# Patient Record
Sex: Female | Born: 1980 | Race: White | Hispanic: No | Marital: Married | Smoking: Never smoker
Health system: Southern US, Community
[De-identification: ages and names within clinical notes are randomized; demographics above are authoritative.]

---
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
## 1999-03-03 ENCOUNTER — Inpatient Hospital Stay (HOSPITAL_COMMUNITY): Admission: AD | Admit: 1999-03-03 | Discharge: 1999-03-03 | Payer: Self-pay | Admitting: Obstetrics and Gynecology

## 2000-01-18 ENCOUNTER — Emergency Department (HOSPITAL_COMMUNITY): Admission: EM | Admit: 2000-01-18 | Discharge: 2000-01-18 | Payer: Self-pay | Admitting: Emergency Medicine

## 2000-02-06 ENCOUNTER — Inpatient Hospital Stay (HOSPITAL_COMMUNITY): Admission: EM | Admit: 2000-02-06 | Discharge: 2000-02-06 | Payer: Self-pay | Admitting: Obstetrics and Gynecology

## 2000-02-06 ENCOUNTER — Encounter: Payer: Self-pay | Admitting: Obstetrics and Gynecology

## 2000-06-07 ENCOUNTER — Encounter: Payer: Self-pay | Admitting: *Deleted

## 2000-06-07 ENCOUNTER — Emergency Department (HOSPITAL_COMMUNITY): Admission: EM | Admit: 2000-06-07 | Discharge: 2000-06-07 | Payer: Self-pay | Admitting: Emergency Medicine

## 2001-03-08 ENCOUNTER — Other Ambulatory Visit: Admission: RE | Admit: 2001-03-08 | Discharge: 2001-03-08 | Payer: Self-pay | Admitting: Obstetrics and Gynecology

## 2001-04-12 ENCOUNTER — Encounter: Payer: Self-pay | Admitting: Obstetrics and Gynecology

## 2001-04-12 ENCOUNTER — Ambulatory Visit (HOSPITAL_COMMUNITY): Admission: RE | Admit: 2001-04-12 | Discharge: 2001-04-12 | Payer: Self-pay | Admitting: Obstetrics and Gynecology

## 2001-07-01 ENCOUNTER — Encounter (HOSPITAL_COMMUNITY): Admission: AD | Admit: 2001-07-01 | Discharge: 2001-07-31 | Payer: Self-pay | Admitting: Obstetrics and Gynecology

## 2001-08-31 ENCOUNTER — Inpatient Hospital Stay (HOSPITAL_COMMUNITY): Admission: AD | Admit: 2001-08-31 | Discharge: 2001-08-31 | Payer: Self-pay | Admitting: Obstetrics and Gynecology

## 2001-09-02 ENCOUNTER — Inpatient Hospital Stay (HOSPITAL_COMMUNITY): Admission: AD | Admit: 2001-09-02 | Discharge: 2001-09-04 | Payer: Self-pay | Admitting: Obstetrics and Gynecology

## 2002-03-04 ENCOUNTER — Inpatient Hospital Stay (HOSPITAL_COMMUNITY): Admission: AD | Admit: 2002-03-04 | Discharge: 2002-03-04 | Payer: Self-pay | Admitting: Obstetrics and Gynecology

## 2002-09-11 ENCOUNTER — Emergency Department (HOSPITAL_COMMUNITY): Admission: EM | Admit: 2002-09-11 | Discharge: 2002-09-11 | Payer: Self-pay | Admitting: Emergency Medicine

## 2003-09-29 ENCOUNTER — Emergency Department (HOSPITAL_COMMUNITY): Admission: AD | Admit: 2003-09-29 | Discharge: 2003-09-29 | Payer: Self-pay | Admitting: Family Medicine

## 2004-04-03 ENCOUNTER — Encounter (INDEPENDENT_AMBULATORY_CARE_PROVIDER_SITE_OTHER): Payer: Self-pay | Admitting: Specialist

## 2004-04-03 ENCOUNTER — Ambulatory Visit (HOSPITAL_COMMUNITY): Admission: RE | Admit: 2004-04-03 | Discharge: 2004-04-03 | Payer: Self-pay | Admitting: Orthopedic Surgery

## 2004-04-03 ENCOUNTER — Ambulatory Visit (HOSPITAL_BASED_OUTPATIENT_CLINIC_OR_DEPARTMENT_OTHER): Admission: RE | Admit: 2004-04-03 | Discharge: 2004-04-03 | Payer: Self-pay | Admitting: Orthopedic Surgery

## 2004-05-31 ENCOUNTER — Ambulatory Visit: Payer: Self-pay | Admitting: Internal Medicine

## 2004-06-03 ENCOUNTER — Ambulatory Visit (HOSPITAL_COMMUNITY): Admission: RE | Admit: 2004-06-03 | Discharge: 2004-06-03 | Payer: Self-pay | Admitting: Internal Medicine

## 2004-06-14 ENCOUNTER — Ambulatory Visit: Payer: Self-pay | Admitting: Internal Medicine

## 2004-06-27 ENCOUNTER — Inpatient Hospital Stay (HOSPITAL_COMMUNITY): Admission: RE | Admit: 2004-06-27 | Discharge: 2004-07-02 | Payer: Self-pay | Admitting: Neurosurgery

## 2004-06-27 ENCOUNTER — Ambulatory Visit: Payer: Self-pay | Admitting: Internal Medicine

## 2004-07-16 ENCOUNTER — Ambulatory Visit (HOSPITAL_COMMUNITY): Admission: RE | Admit: 2004-07-16 | Discharge: 2004-07-16 | Payer: Self-pay | Admitting: Neurosurgery

## 2004-08-06 ENCOUNTER — Other Ambulatory Visit: Admission: RE | Admit: 2004-08-06 | Discharge: 2004-08-06 | Payer: Self-pay | Admitting: Obstetrics and Gynecology

## 2005-03-08 ENCOUNTER — Emergency Department (HOSPITAL_COMMUNITY): Admission: EM | Admit: 2005-03-08 | Discharge: 2005-03-08 | Payer: Self-pay | Admitting: Emergency Medicine

## 2005-07-23 ENCOUNTER — Emergency Department (HOSPITAL_COMMUNITY): Admission: EM | Admit: 2005-07-23 | Discharge: 2005-07-23 | Payer: Self-pay | Admitting: Emergency Medicine

## 2005-10-31 ENCOUNTER — Other Ambulatory Visit: Admission: RE | Admit: 2005-10-31 | Discharge: 2005-10-31 | Payer: Self-pay | Admitting: Obstetrics and Gynecology

## 2007-08-15 ENCOUNTER — Inpatient Hospital Stay (HOSPITAL_COMMUNITY): Admission: AD | Admit: 2007-08-15 | Discharge: 2007-08-15 | Payer: Self-pay | Admitting: Obstetrics and Gynecology

## 2007-08-16 ENCOUNTER — Inpatient Hospital Stay (HOSPITAL_COMMUNITY): Admission: AD | Admit: 2007-08-16 | Discharge: 2007-08-18 | Payer: Self-pay | Admitting: Obstetrics and Gynecology

## 2007-08-19 ENCOUNTER — Inpatient Hospital Stay (HOSPITAL_COMMUNITY): Admission: AD | Admit: 2007-08-19 | Discharge: 2007-08-20 | Payer: Self-pay | Admitting: Obstetrics and Gynecology

## 2008-03-10 ENCOUNTER — Emergency Department (HOSPITAL_COMMUNITY): Admission: EM | Admit: 2008-03-10 | Discharge: 2008-03-11 | Payer: Self-pay | Admitting: Emergency Medicine

## 2009-09-21 IMAGING — CR DG FOOT COMPLETE 3+V*L*
3 series · 3 of 3 positions shown · non-contrast
Comparison: 09/29/2003.

CLINICAL DATA: Trauma.

LEFT FOOT - COMPLETE 3+ VIEW

[t foot lat left]
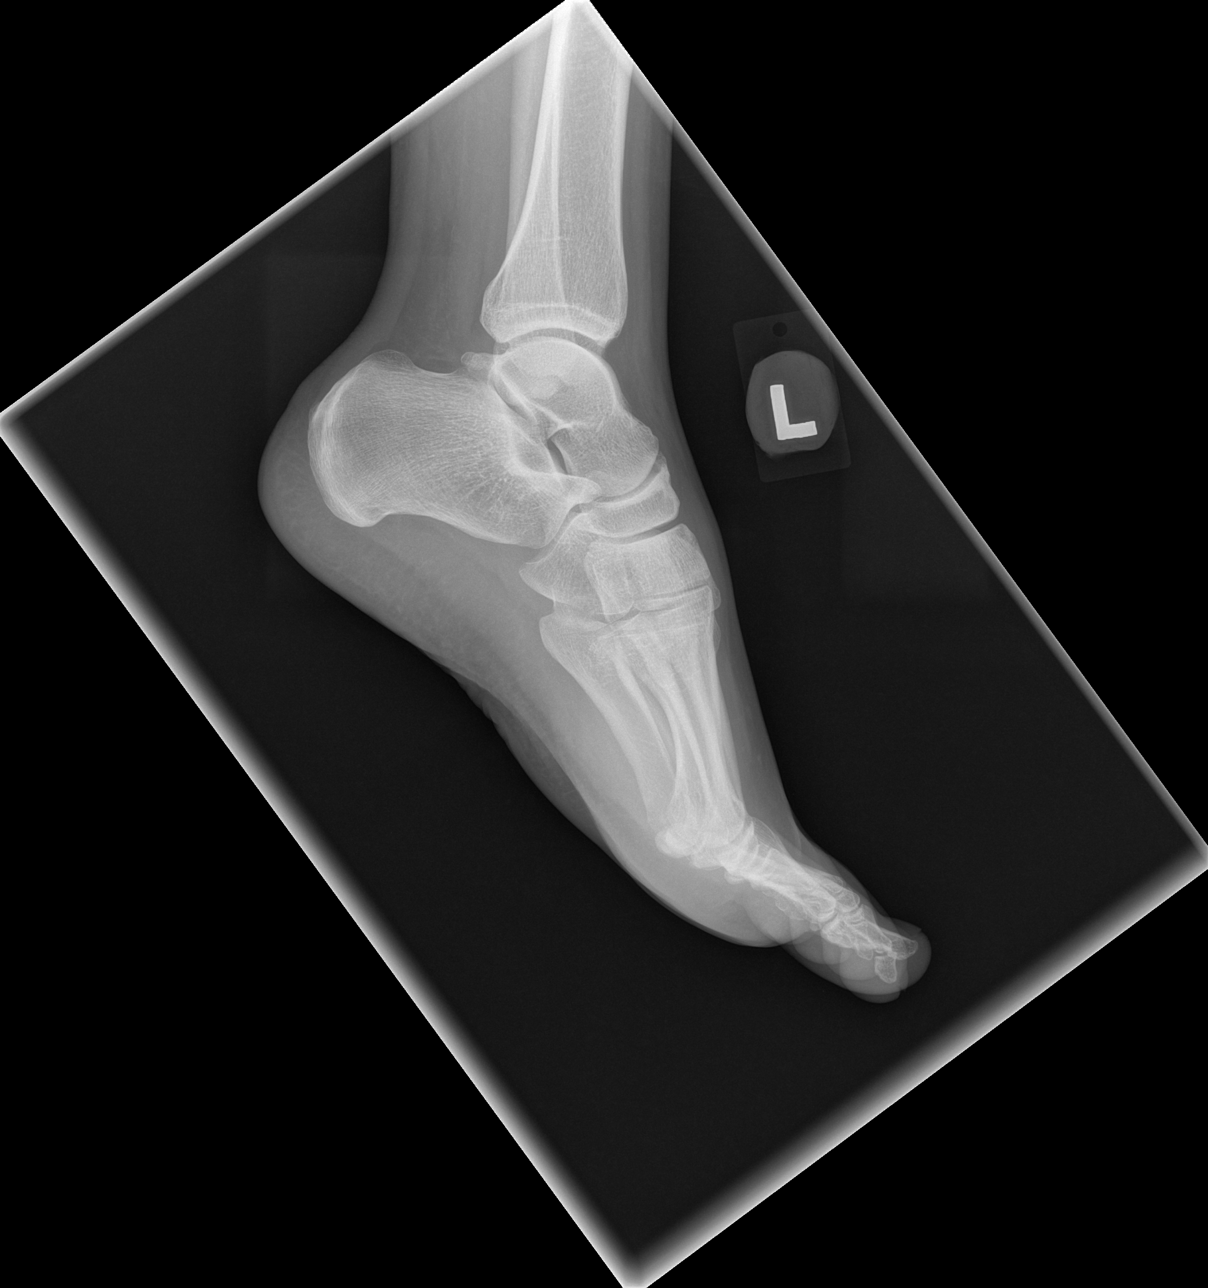

[t foot ap left]
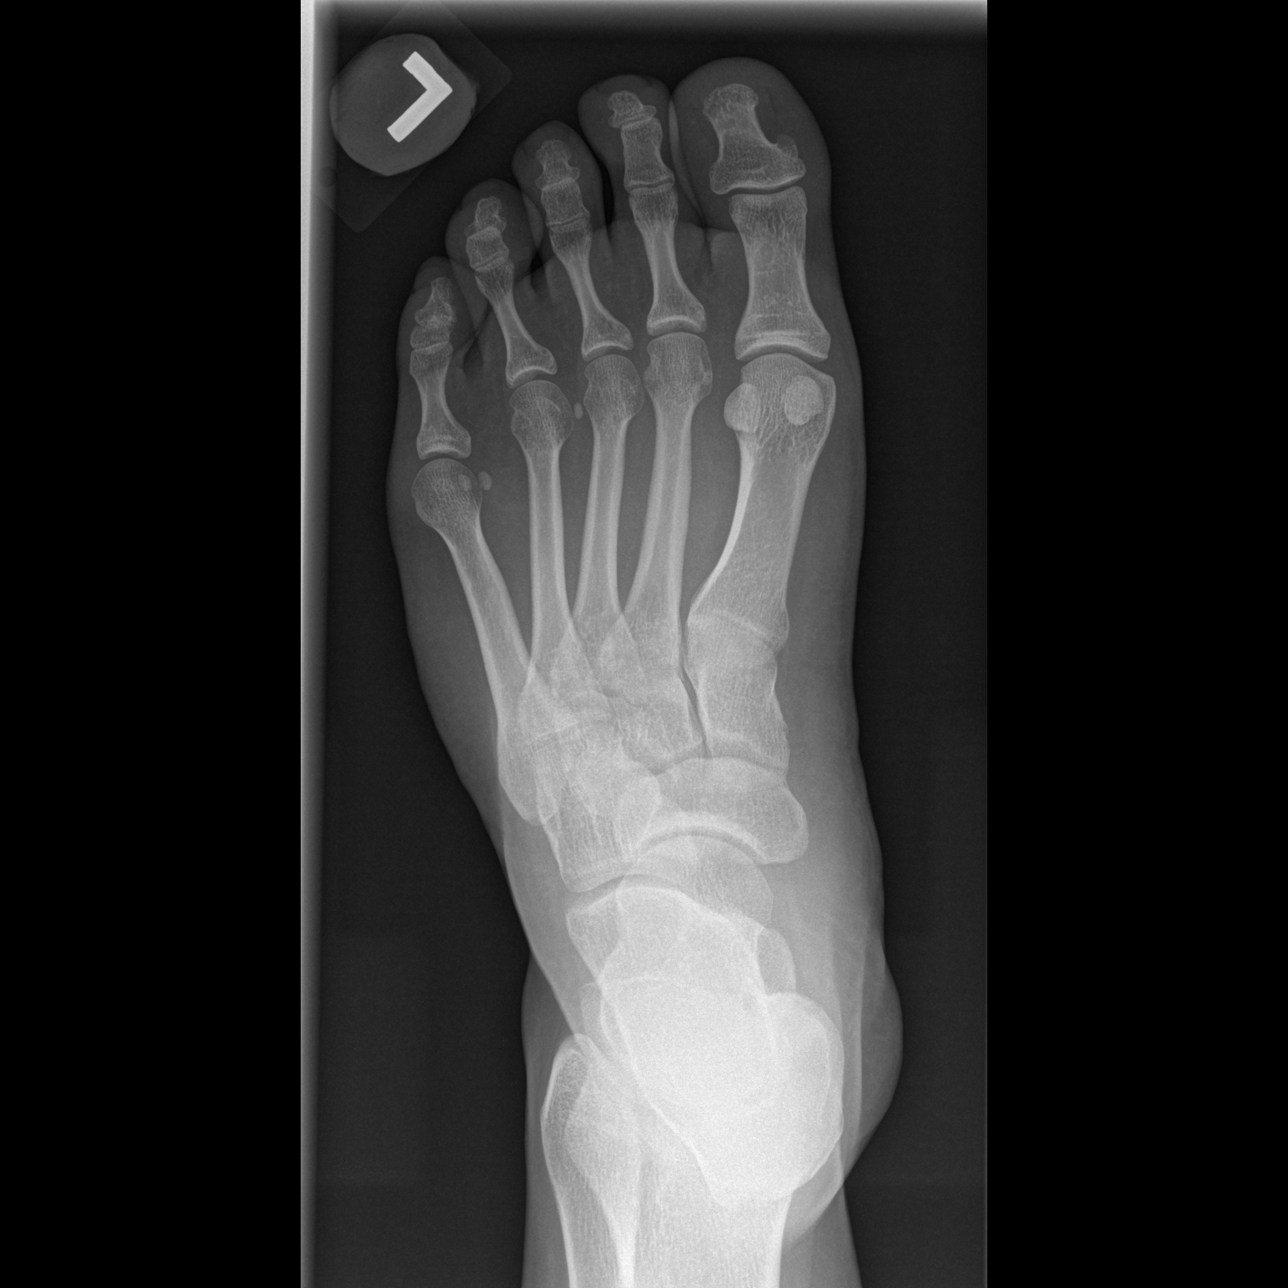

[t foot oblique left]
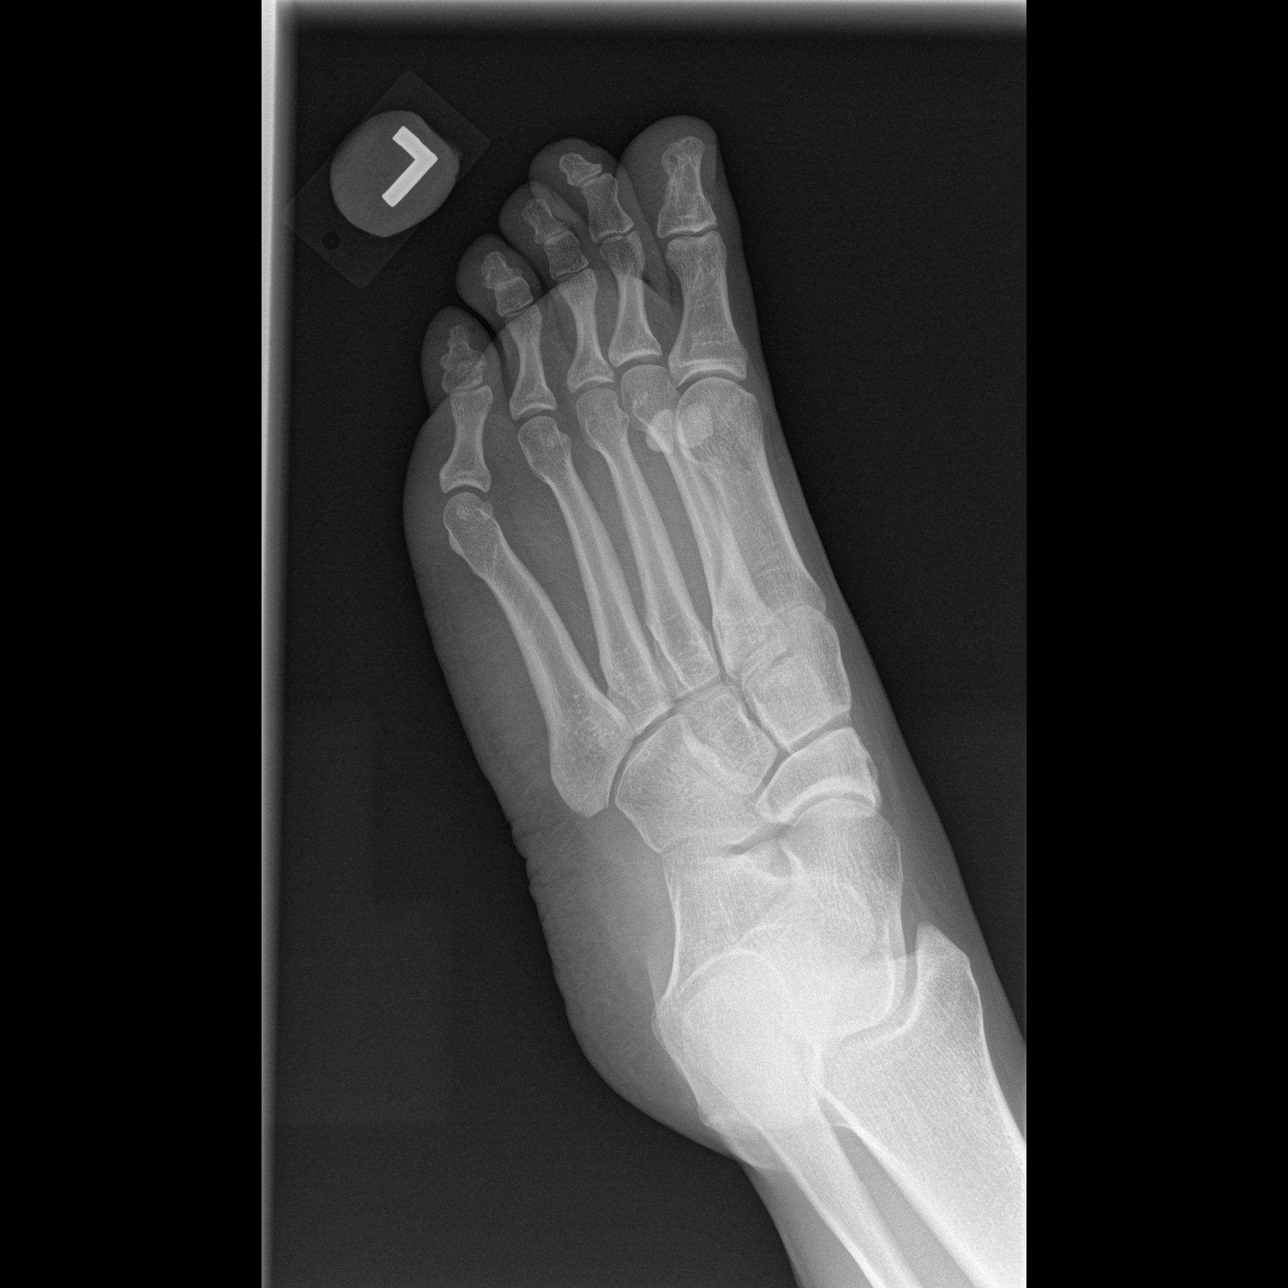

[3 of 3 positions shown; findings below may reference images not displayed]

FINDINGS: No fracture or dislocation.
IMPRESSION: No fracture

## 2009-09-21 IMAGING — CR DG KNEE COMPLETE 4+V*L*
4 series · 4 of 4 positions shown · non-contrast
Comparison: None.

CLINICAL DATA: Fall.

LEFT KNEE - COMPLETE 4+ VIEW

[t knee ap left]
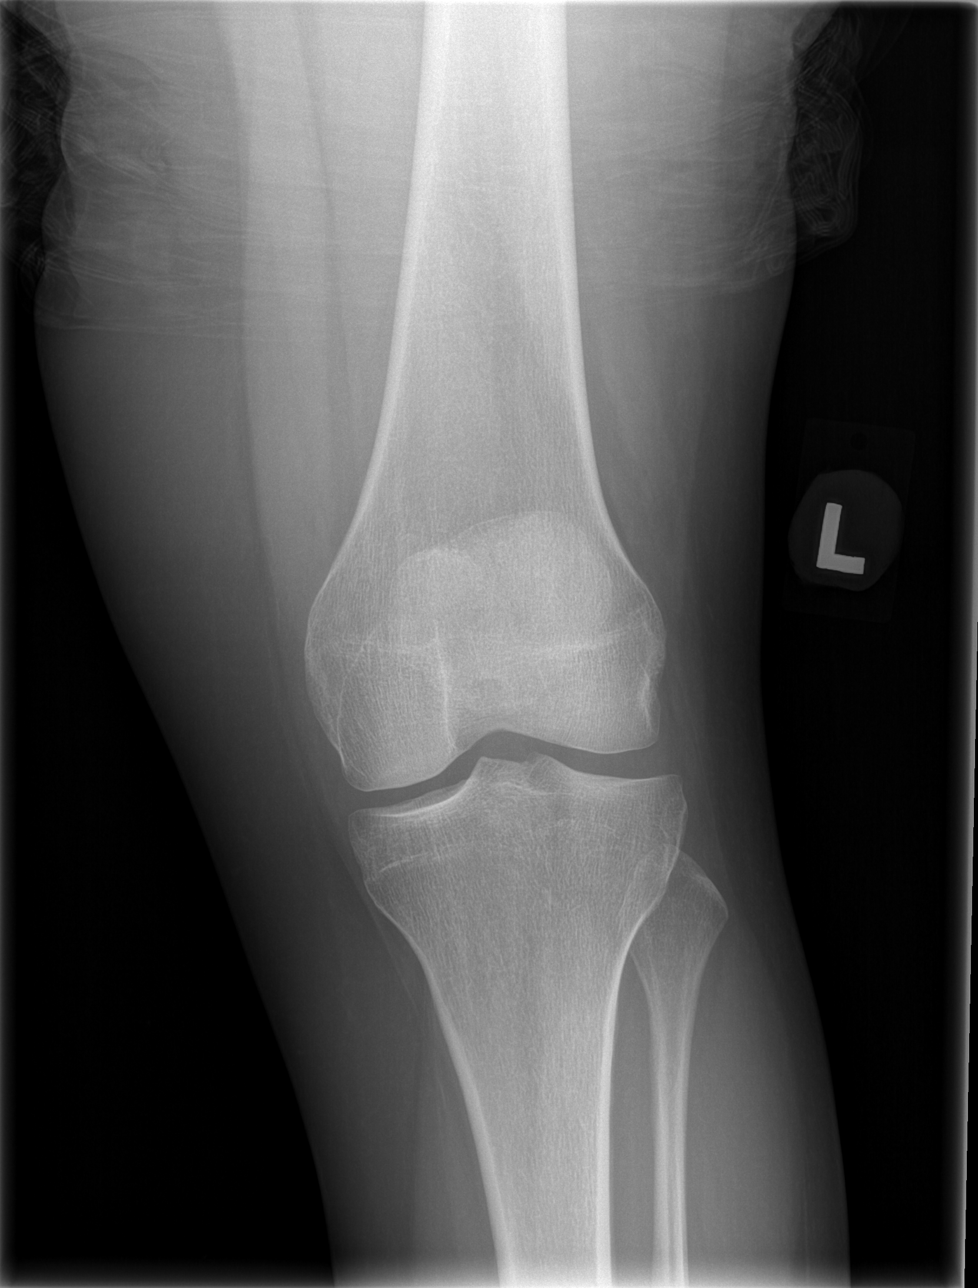

[t knee oblique left (1 of 2)]
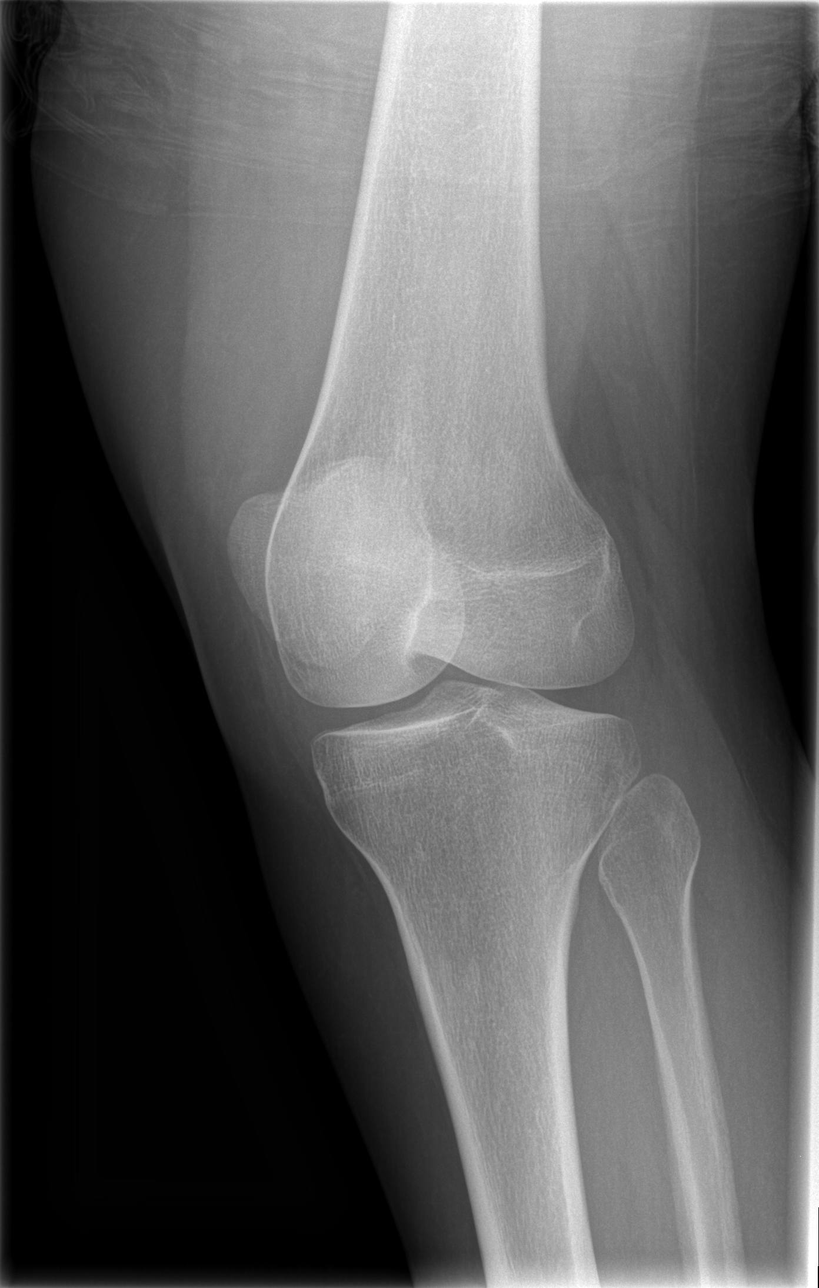

[t knee oblique left (2 of 2)]
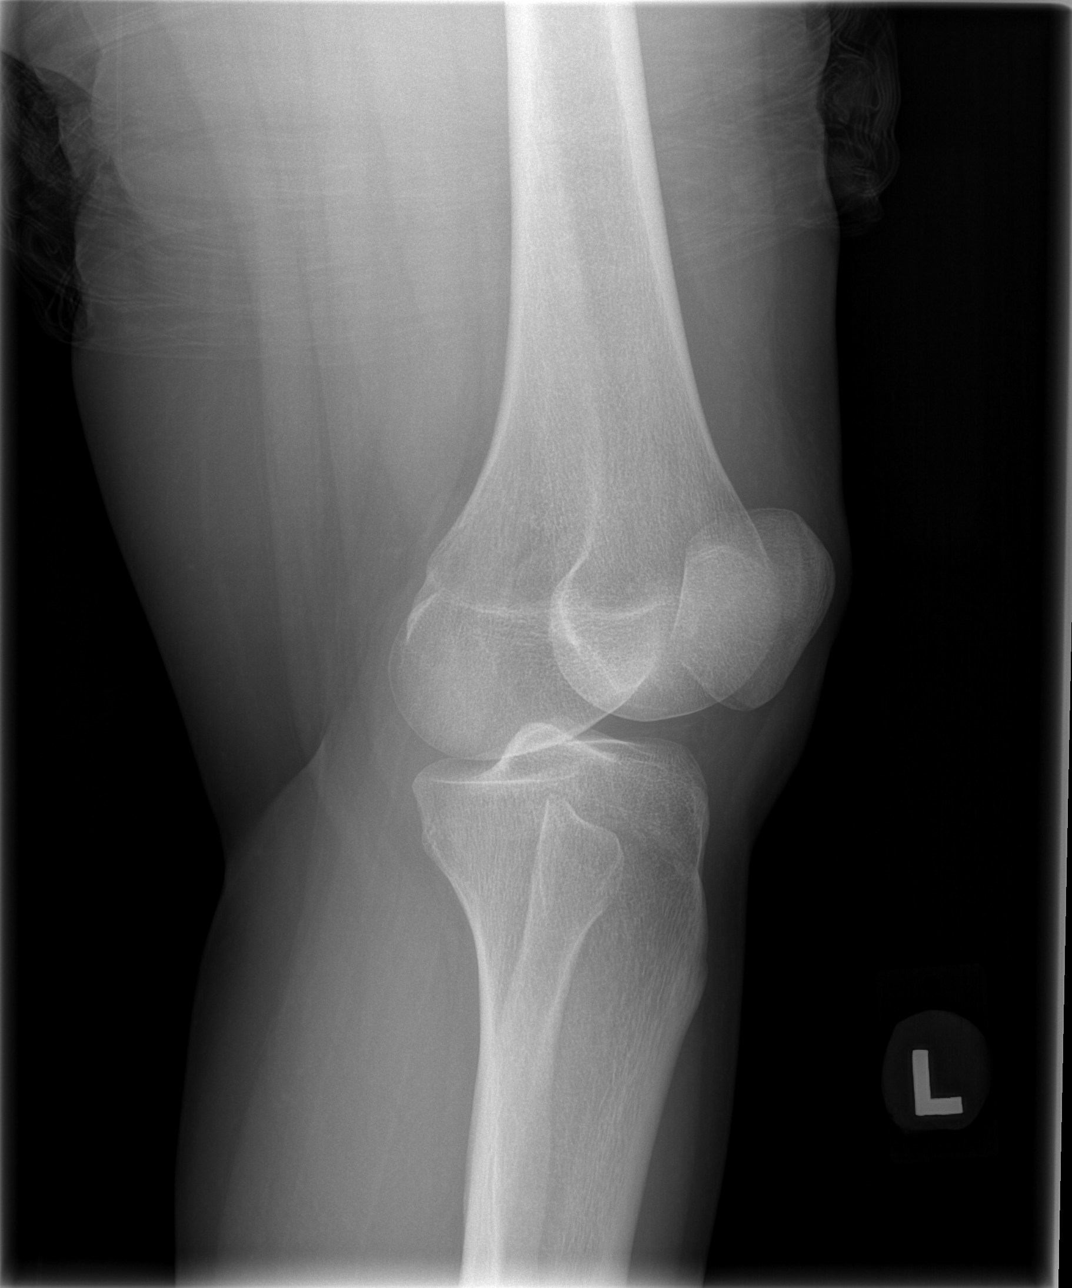

[t knee lat left]
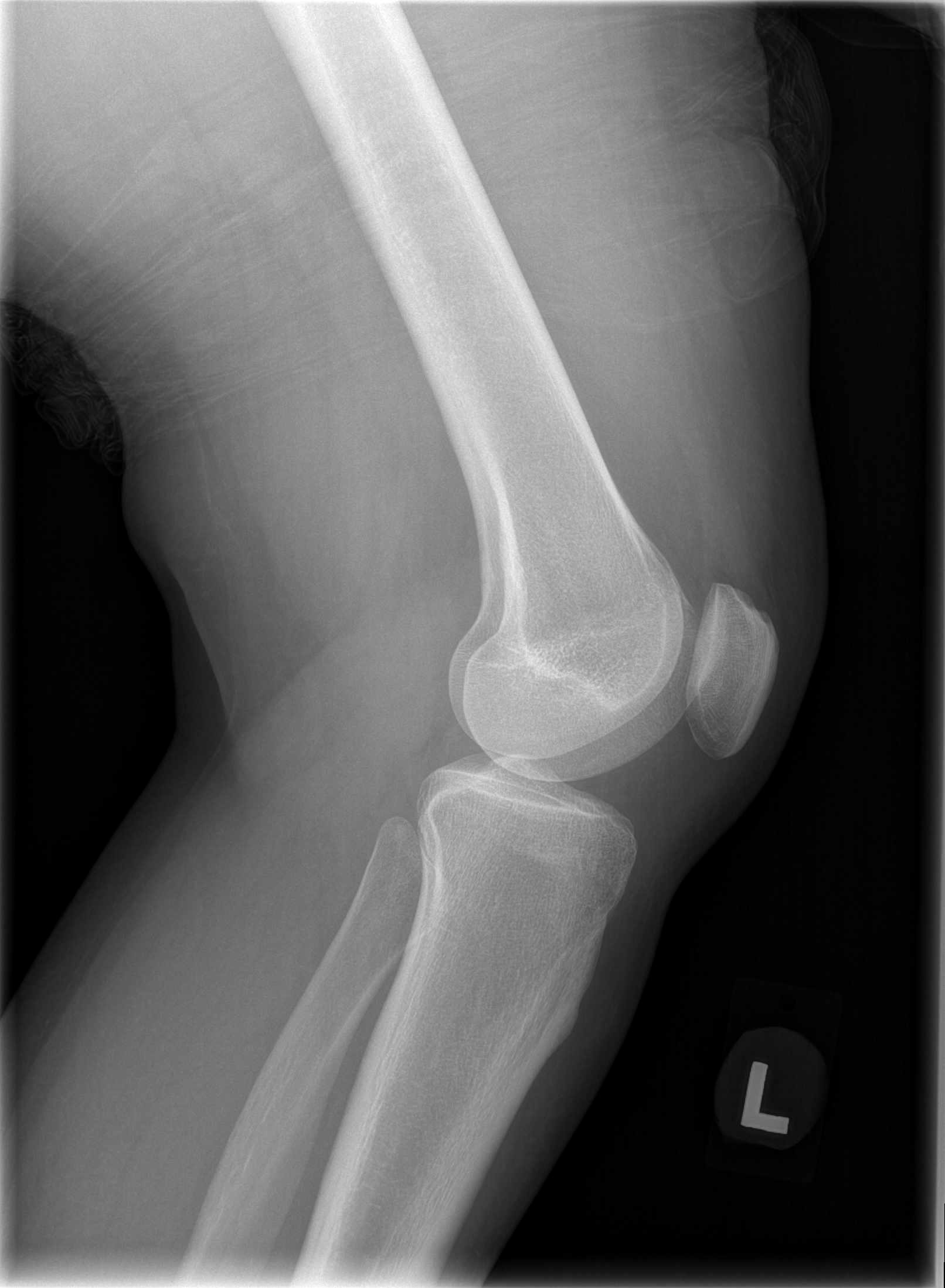

[4 of 4 positions shown; findings below may reference images not displayed]

FINDINGS: No fracture or dislocation.
IMPRESSION: No fracture.

## 2010-11-12 NOTE — Discharge Summary (Signed)
NAMEAIREAL, SLATER                ACCOUNT NO.:  [id number]   MEDICAL RECORD NO.:  [id number]          PATIENT TYPE:  INP   LOCATION:  9114                          FACILITY:  [facility]   PHYSICIAN:  Zenaida Niece, M.D.DATE OF BIRTH:  September 01, 1980   DATE OF ADMISSION:  08/16/2007  DATE OF DISCHARGE:  08/18/2007                               DISCHARGE SUMMARY   ADMISSION DIAGNOSES:  Intrauterine pregnancy at 37 weeks, premature  rupture of membranes, meconium stained amniotic fluid.   DISCHARGE DIAGNOSES:  Intrauterine pregnancy at 37 weeks, premature  rupture of membranes, meconium stained amniotic fluid.   PROCEDURES:  On February 16 she had a spontaneous vaginal delivery.   HISTORY AND PHYSICAL:  This is a 30 year old white female gravida 2,  para 1-0-0-1 with an EGA of [redacted] weeks who presents with complaint of  leaking fluid since early on the morning of admission.  Evaluation in  triage confirmed ruptured membranes and cervix was 4 cm dilated.  Prenatal care complicated by a low-grade SIL Pap smear, palpitations  which resolved and she recently took antibiotics for sinusitis.  Prenatal labs, blood type is O positive with a negative antibody screen,  RPR nonreactive, rubella immune, hepatitis B surface antigen negative,  HIV negative, gonorrhea and chlamydia negative, cystic fibrosis  negative, 1 hour Glucola 163, 3 hour GTT 74/127/121/81 and group B strep  is negative.   PAST OB HISTORY:  One vaginal delivery at 38+ weeks, 7 pounds 4 ounces,  no complications.   PAST MEDICAL HISTORY:  Asthma and history of a Chiari malformation.   PAST SURGICAL HISTORY:  Appendectomy, wisdom teeth removal, adenoid  removal and surgery for her Chiari malformation.   ALLERGIES:  To SULFA which causes hives.   PHYSICAL EXAM:  VITAL SIGNS:  She is afebrile with stable vital signs.  Fetal heart tracing reactive with irregular contractions.  ABDOMEN:  Gravid, nontender with an estimated fetal weight  of 7 pounds.  Cervix was 4-5, 50, -1 and -2 vertex presentation, adequate pelvis.   HOSPITAL COURSE:  The patient was admitted and received an epidural for  pain.  She was then started on Pitocin for augmentation.  She  progressed, reached complete, pushed well and on the afternoon of  February 16 had a vaginal delivery of a viable female infant with Apgars  of 9 and 9, weight 7 pounds 4 ounces.  She had bulb suction only for  meconium and the baby was vigorous.  Placenta delivered spontaneous and  was intact and sent for cord blood collection.  She had a small  periurethral laceration which was hemostatic and not repaired.  Estimated blood loss was less than 500 mL.  Postpartum she had no  significant complications.  Pre delivery hemoglobin 13.3, post delivery  12.2 and on postpartum day #2 she was felt to be stable enough for  discharge home.   DISCHARGE INSTRUCTIONS:  Regular diet, pelvic rest, follow up in 6  weeks.   MEDICATIONS:  1. Are Percocet #20 one to two p.o. q.4-6 h. p.r.n. pain.  2. Over-the-counter ibuprofen as needed.  She is given our discharge pamphlet.      Zenaida Niece, M.D.  Electronically Signed     TDM/MEDQ  D:  08/18/2007  T:  08/18/2007  Job:  [id number]

## 2010-11-12 NOTE — H&P (Signed)
Teresa [name], KENTNER                ACCOUNT NO.:  [id number]   MEDICAL RECORD NO.:  [id number]          PATIENT TYPE:  INP   LOCATION:  9319                          [facility]ACILITY[facility]  [facility]   PHYSICIAN:  Malachi Pro. Ambrose Mantle, M.D. DATE OF BIRTH:  [birth year]   DATE OF ADMISSION:  08/19/2007  DATE OF DISCHARGE:                              HISTORY & PHYSICAL   This is a 30 year old, white, married female para 2-0-0-2, two days  postpartum who is seen with fever, abdominal pain and pain in her back.  The patient had an epidural during delivery but had pains in her back  and shoulders. The epidural was stopped because it was thought to be a  relationship between the epidural and the pain. She had a vaginal  delivery that was uncomplicated.  She was discharged on August 18, 2007 and later on that day developed a fever as high as 102.7. She has  had some chills.  She has had some abdominal pain.  No dysuria.  She has  had a good appetite, some nausea and vomited x1.   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   PAST SURGICAL HISTORY:  Brain decompression, appendectomy, T&A.   ILLNESSES:  Chiari malformation.   ALCOHOL/TOBACCO/DRUGS:  None.   FAMILY HISTORY:  Mother 38 is a smoker.  Father's health is unknown.  Five brothers and one sister are living and well except one brother has  kidney problems.   PHYSICAL EXAMINATION:  VITAL SIGNS:  The blood pressure is 144/79,  temperature 99.2, pulse 95.  GENERAL:  A well-developed, somewhat ill-appearing, white female in no  acute distress.  HEENT:  No abnormalities noted.  HEART:  Normal size and sounds.  No murmurs.  LUNGS:  Clear to auscultation.  ABDOMEN:  Soft. The uterus is tender to palpation. The vulva and vagina  show lochia present.  The cervix is clean.  The uterus is 18 weeks size  and tender.  The adnexa are clear. I did examine the back, I found no  sign of any problem in the back.   ADMITTING IMPRESSION:  Postpartum fever, likely  secondary to  endometritis.  The patient is admitted. A cath urine is sent for  culture. She is begun on IV antibiotics, Unasyn 3 grams IV q.6 h. CBC is  drawn and the patient will be observed for progress.      Malachi Pro. Ambrose Mantle, M.D.  Electronically Signed     TFH/MEDQ  D:  08/19/2007  T:  08/19/2007  Job:  [id number]

## 2010-11-15 NOTE — Op Note (Signed)
NAMEPRESLEY, Teresa [name]                ACCOUNT NO.:  [id number]   MEDICAL RECORD NO.:  [id number]          PATIENT TYPE:  INP   LOCATION:  2899                         FACILITY:  [facility]   PHYSICIAN:  Danae Orleans. Venetia Maxon, M.D.  DATE OF BIRTH:  1980/07/19   DATE OF PROCEDURE:  06/27/2004  DATE OF DISCHARGE:                                 OPERATIVE REPORT   PREOPERATIVE DIAGNOSIS:  Chiari malformation with intractable headaches.   POSTOPERATIVE DIAGNOSIS:  Chiari malformation with intractable headaches.   PROCEDURE:  Chiari decompression with C1 and C2 laminectomy with a dural  patch graft and microdissection.   SURGEON:  Danae Orleans. Venetia Maxon, M.D.   ASSISTANT:  Payton Doughty, M.D.   ANESTHESIA:  General endotracheal anesthesia.   ESTIMATED BLOOD LOSS:  Minimal, less than 100 mL.   COMPLICATIONS:  None.   DISPOSITION:  To the recovery room.   INDICATIONS FOR PROCEDURE:  The patient is a 30 year old young woman with  severe intractable headaches.  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  It was felt that this was likely symptomatic, and it  was therefore elected to take her to surgery for a Chiari decompression.   DESCRIPTION OF PROCEDURE:  The patient was brought to the operating room.  Following the satisfactory and uncomplicated induction of general  endotracheal anesthesia, the patient was placed in a three-pin head fixation  and was carefully rolled into a prone position on the operating room table.  Her neck was maintained in a flexed attitude.  Her posterior neck was then  shaved, prepped and draped in the usual sterile fashion.  The area of plane  of incision was infiltrated with 0.25% Marcaine and 0.5% lidocaine with  1:200,000 epinephrine.  An incision was made in the midline from he inion to  just below C2 and  carried through the avascular midline plane to expose the  lamina of C2 and the posterior ring of C1 in the suboccipital region of the  skull.  Using loupe magnification and the high-speed drill, the suboccipital  region was then thinned, and bone removal was completed with the Kerrison  rongeur.  A Leksell rongeur was used to remove the posterior aspect of the  C1 ring, and then also the superior aspect of C2 was then thinned with the  high-speed drill, and then bone removal was completed with the Kerrison  rongeur.  The thick band at the base of the foramen magnum was then  carefully mobilized and removed.  The dura was then incised under  microscopic visualization along the midline at C2.  It was then carried up  to just above the foramen magnum, where the dural incision was wide over  each cerebellar hemisphere.  The arachnoid was maintained intact, and under  microscopic visualization it was quite clear that the cerebellar tonsils  parted easily, and there was free flow of CSF around them.  It was therefore  elected not to  further disturb the arachnoid layer.  A Dura Graft 6 cm x 8  cm graft was then selected and fashioned for a dural patch graft, and then  this was sutured in position using running #6-0 Prolene suture.  An  occlusive seal was made and with Valsalva maneuver there is no evidence of  any leakage of CSF.  Subsequently the self-retaining retractor was then  removed.  The posterior nuchal musculature was reapproximated with #0 Vicryl  sutures.  The posterior cervical fascia was reapproximated with interrupted  #2-0 Vicryl inverted sutures, and the skin edges were reapproximated with a  running #3-0 nylon stitch.  The wound was dressed with a sterile occlusive  dressing.  The patient was then taken out of the three-pin fixation and was taken to  the recovery room in stable and satisfactory condition, having tolerated her  operation well, without any complications.  The  counts were correct at the  end of the case.      Jose   JDS/MEDQ  D:  06/27/2004  T:  06/27/2004  Job:  [id number]

## 2010-11-15 NOTE — Discharge Summary (Signed)
Teresa [name], Teresa [name]                ACCOUNT NO.:  [id number]   MEDICAL RECORD NO.:  [id number]          PATIENT TYPE:  INP   LOCATION:  9319                          [facility]ACILITY[facility]   PHYSICIAN:  Zenaida Niece, M.D.DATE OF BIRTH:  [birth year]   DATE OF ADMISSION:  08/19/2007  DATE OF DISCHARGE:  08/20/2007                               DISCHARGE SUMMARY   ADMISSION DIAGNOSIS:  Postpartum fever, possible endometritis.   DISCHARGE DIAGNOSIS:  Postpartum fever, possible endometritis.   PROCEDURE:  IV antibiotics.   HISTORY AND PHYSICAL:  Please see chart for dictated history and  physical.  Briefly, this is a 30 year old white female para 2-0-0-2 who  is 2 days status post vaginal delivery, who went home on the morning of  August 18, 2007, and presents early in the morning on August 19, 2007, with a complaint of fever, abdominal pain, and shaking chills.  She reports a fever at home to 102.7 as well as abdominal pain.  She had  no dysuria, good appetite with some nausea and one episode of emesis.   PHYSICAL EXAMINATION:  VITAL SIGNS:  Significant for a temperature of  99.2.  Remainder of her vitals were stable.  ABDOMEN:  Soft.  PELVIC EXAM:  Cervix was clear.  Uterus was 18-week size and tender.  Adnexa had no masses.  BACK:  Normal.   HOSPITAL COURSE:  Dr. Ambrose Mantle examined the patient and admitted her with  a diagnosis of likely endometritis.  She was put on Unasyn.  She  remained afebrile throughout her hospital stay.  Labs reveal an  admission white count of 9.4 and a hemoglobin of 11.5.  Cervical culture  grew rare Staphylococcus aureus.  Gonorrhea and Chlamydia were normal.  Again, she remained afebrile on the Unasyn and felt better and on the  evening of August 20, 2007, she was felt to be stable enough for  discharge home.   DISCHARGE INSTRUCTIONS:  Regular diet, pelvic rest, and otherwise  activity as tolerated.  She is to call for elevated temperature and  otherwise follow up for her previously scheduled 6-week visit.      Zenaida Niece, M.D.  Electronically Signed     TDM/MEDQ  D:  09/10/2007  T:  09/10/2007  Job:  [id number]

## 2010-11-15 NOTE — Discharge Summary (Signed)
Teresa [name], Teresa [name]                ACCOUNT NO.:  [id number]   MEDICAL RECORD NO.:  [id number]          PATIENT TYPE:  INP   LOCATION:  3008                         FACILITY:  [facility]   PHYSICIAN:  Danae Orleans. Venetia Maxon, M.D.  DATE OF BIRTH:  April 22, 1981   DATE OF ADMISSION:  06/27/2004  DATE OF DISCHARGE:  07/02/2004                                 DISCHARGE SUMMARY   REFERRING PHYSICIAN:  Thomos Lemons, D.O.   REASON FOR ADMISSION:  Chiari malformation.   FINAL DIAGNOSES:  1.  Chiari malformation.  2.  Pulmonary class obesity.   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  She has cerebellar tonsils which were 15 mm below the foramen  magnum.  She did not have evidence of a syrinx or hydrocephalus.  She is 5  feet 4 inches tall, 200 pounds.  She was felt to be a good candidate for  Chiari decompression and underwent uncomplicated posterior cervical  laminectomy of C1 and C2 with dural patch grafting and Chiari decompression.  Postoperatively, she was doing well, had some incisional pain, was gradually  mobilized as tolerated.  Had an episode of decreased respirations with  saturation of 89% on six liters nasal cannula.  She was seen by the critical  care service and was felt to have significant atelectasis and decreased air  movement with analgesics and secondary to her obesity.  She was put on BiPAP  and did better with that and had resolution of her pulmonary complaints as  of July 01, 2004, at which point she was doing well and was transferred to  inpatient floor unit.  She was then discharged home on July 02, 2004, with  instructions to follow up in one week for suture removal.   DISCHARGE MEDICATIONS:  1.  Percocet.  2.  Valium.  3.  Pepcid.  4.  Decadron taper.   DISCHARGE STATUS:  Improved.   FINAL DIAGNOSES:  1.  Chiari malformation.  2.  Pulmonary  class obesity.      JDS/MEDQ  D:  09/19/2004  T:  09/19/2004  Job:  [id number]   cc:   Thomos Lemons, D.O. [facility]
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
## 2010-11-15 NOTE — Op Note (Signed)
[name], [name]                ACCOUNT NO.:  [id number]   MEDICAL RECORD NO.:  [id number]          PATIENT TYPE:  AMB   LOCATION:  DSC                          FACILITY:  [facility]   PHYSICIAN:  Cindee Salt, M.D.       DATE OF BIRTH:  [birth year]   DATE OF PROCEDURE:  04/03/2004  DATE OF DISCHARGE:                                 OPERATIVE REPORT   PREOPERATIVE DIAGNOSIS:  Mass, right palm.   POSTOPERATIVE DIAGNOSIS:  Mass, right palm.   OPERATION PERFORMED:  Excision of mass, right palm.   SURGEON:  Cindee Salt, M.D.   ASSISTANTCarolyne Fiscal.   ANESTHESIA:  General.   INDICATIONS FOR PROCEDURE:  The patient is a 30 year old female with a  history of a mass on the palm of her right hand.  This has become painful  for her.  She is desirous of removal.  Ultrasound reveals this to be solid.   DESCRIPTION OF PROCEDURE:  The patient was brought to the operating room  where a general anesthetic was carried out without difficulty.  She was  prepped using DuraPrep in supine position, right arm free.  Forearm  tourniquet was inflated to 250 mmHg after exsanguination with an Esmarch  bandage.  The oblique incision was made directly over the mass and carried  down through subcutaneous tissue.  The mass was immediately encountered.  With blunt and sharp dissection this was dissected free.  It appeared to be  adherent to a portion of the palmar fascia.  The entire specimen was excised  and sent to pathology.  The wound was irrigated. The skin was then closed  with interrupted 5-0 nylon suture.  Sterile compressive dressing was  applied.  The patient tolerated the procedure well and was taken to the  recovery room for observation in satisfactory condition.  She is discharged  to home to return to the [facility] in one week on Vicodin.       GK/MEDQ  D:  04/03/2004  T:  04/03/2004  Job:  [id number]

## 2011-03-21 LAB — URINALYSIS, ROUTINE W REFLEX MICROSCOPIC
Bilirubin Urine: NEGATIVE
Glucose, UA: NEGATIVE
Hgb urine dipstick: NEGATIVE
Ketones, ur: 15 — AB
Nitrite: NEGATIVE
Protein, ur: NEGATIVE
Specific Gravity, Urine: 1.02
Urobilinogen, UA: 0.2
pH: 5.5

## 2011-03-21 LAB — DIFFERENTIAL
Basophils Absolute: 0
Basophils Relative: 0
Eosinophils Absolute: 0.1
Eosinophils Relative: 1
Lymphocytes Relative: 11 — ABNORMAL LOW
Lymphs Abs: 1
Monocytes Absolute: 0.4
Monocytes Relative: 5
Neutro Abs: 7.7
Neutrophils Relative %: 83 — ABNORMAL HIGH

## 2011-03-21 LAB — GC/CHLAMYDIA PROBE AMP, GENITAL
Chlamydia, DNA Probe: NEGATIVE
GC Probe Amp, Genital: NEGATIVE

## 2011-03-21 LAB — WOUND CULTURE

## 2011-03-21 LAB — CBC
HCT: 32.6 — ABNORMAL LOW
HCT: 35.3 — ABNORMAL LOW
HCT: 38.5
Hemoglobin: 11.5 — ABNORMAL LOW
Hemoglobin: 12.2
Hemoglobin: 13.3
MCHC: 34.6
MCHC: 34.6
MCHC: 35.4
MCV: 84.6
MCV: 85.8
MCV: 86.3
Platelets: 253
Platelets: 264
Platelets: 284
RBC: 3.85 — ABNORMAL LOW
RBC: 4.09
RBC: 4.49
RDW: 13.7
RDW: 14
RDW: 14.1
WBC: 13 — ABNORMAL HIGH
WBC: 14.2 — ABNORMAL HIGH
WBC: 9.4

## 2011-03-21 LAB — RPR: RPR Ser Ql: NONREACTIVE

## 2011-04-02 LAB — POCT PREGNANCY, URINE: Preg Test, Ur: NEGATIVE

## 2014-03-03 ENCOUNTER — Encounter (HOSPITAL_COMMUNITY): Payer: Self-pay | Admitting: Emergency Medicine

## 2014-03-03 ENCOUNTER — Emergency Department (INDEPENDENT_AMBULATORY_CARE_PROVIDER_SITE_OTHER)
Admission: EM | Admit: 2014-03-03 | Discharge: 2014-03-03 | Disposition: A | Discharge status: Home / Self Care | Payer: BC Managed Care – PPO | Source: Home / Self Care | Attending: Emergency Medicine | Admitting: Emergency Medicine

## 2014-03-03 DIAGNOSIS — J019 Acute sinusitis, unspecified: Secondary | ICD-10-CM

## 2014-03-03 DIAGNOSIS — IMO0002 Reserved for concepts with insufficient information to code with codable children: Secondary | ICD-10-CM

## 2014-03-03 DIAGNOSIS — S90851A Superficial foreign body, right foot, initial encounter: Secondary | ICD-10-CM

## 2014-03-03 MED ORDER — CEPHALEXIN 500 MG PO CAPS: 500.0000 mg | ORAL_CAPSULE | Freq: Three times a day (TID) | ORAL | Status: AC

## 2014-03-03 MED ORDER — MUPIROCIN 2 % EX OINT: 1.0000 "application " | TOPICAL_OINTMENT | Freq: Three times a day (TID) | CUTANEOUS | Status: AC

## 2014-03-03 MED ORDER — LIDOCAINE HCL (PF) 2 % IJ SOLN
INTRAMUSCULAR | Status: AC
  Filled 2014-03-03: qty 2

## 2014-03-03 MED ORDER — HYDROCODONE-ACETAMINOPHEN 5-325 MG PO TABS: ORAL_TABLET | ORAL | Status: AC

## 2014-03-03 MED ORDER — FLUTICASONE PROPIONATE 50 MCG/ACT NA SUSP: 2.0000 | Freq: Every day | NASAL | Status: AC

## 2014-03-03 NOTE — ED Notes (Signed)
States she was in yard walking dog earlier today , when she stepped on sharp plant material , which left multiple puncture wounds to plantar surface of foot. Dark material evident on inspection

## 2014-03-03 NOTE — Discharge Instructions (Signed)
Watch for signs of infection.  Stay off feet and elevate leg.  Soak in warm epsom salt water 3 times daily.   Your blood pressure was elevated today.  Be sure to follow up on this within 2 weeks.  Read material below to find out some ways to control your blood pressure.  Blood pressure over the ideal can put you at higher risk for stroke, heart disease, and kidney failure.  For this reason, it's important to try to get your blood pressure as close as possible to the ideal.  The ideal blood pressure is 120/80.  Blood pressures from 120-139 systolic over 80-89 diastolic are labeled as "prehypertension."  This means you are at higher risk of developing hypertension in the future.  Blood pressures in this range are not treated with medication, but lifestyle changes are recommended to prevent progression to hypertension.  Blood pressures of 140 and above systolic over 90 and above diastolic are classified as hypertension and are treated with medications.  Lifestyle changes which can benefit both prehypertension and hypertension include the following:   Salt and sodium restriction.  Weight loss.  Regular exercise.  Avoidance of tobacco.  Avoidance of excess alcohol.  The "D.A.S.H" diet.   People with hypertension and prehypertension should limit their salt intake to less than 1500 mg daily.  Reading the nutrition information on the label of many prepared foods can give you an idea of how much sodium you're consuming at each meal.  Remember that the most important number on the nutrition information is the serving size.  It may be smaller than you think.  Try to avoid adding extra salt at the table.  You may add small amounts of salt while cooking.  Remember that salt is an acquired taste and you may get used to a using a whole lot less salt than you are using now.  Using less salt lets the food's natural flavors come through.  You might want to consider using salt substitutes, potassium chloride,  pepper, or blends of herbs and spices to enhance the flavor of your food.  Foods that contain the most salt include: processed meats (like ham, bacon, lunch meat, sausage, hot dogs, and breakfast meat), chips, pretzels, salted nuts, soups, salty snacks, canned foods, junk food, fast food, restaurant food, mustard, pickles, pizza, popcorn, soy sauce, and worcestershire sauce--quite a list!  You might ask, "Is there anything I can eat?"  The answer is, "yes."  Fruits and vegetables are usually low in salt.  Fresh is better than frozen which is better than canned.  If you have canned vegetables, you can cut down on the salt content by rinsing them in tap water 3 times before cooking.     Weight loss is the second thing you can do to lower your blood pressure.  Getting to and maintaining ideal weight will often normalize your blood pressure and allow you to avoid medications, entirely, cut way down on your dosage of medications, or allow to wean off your meds.  (Note, this should only be done under the supervision of your primary care doctor.)  Of course, weight loss takes time and you may need to be on medication in the meantime.  You shoot for a body mass index of 20-25.  When you go to the urgent care or to your primary care doctor, they should calculate your BMI.  If you don't know what it is, ask.  You can calculate your BMI with the following formula:  Weight in pounds x 703/ (height in inches) x (height in inches).  There are many good diets out there: Weight Watchers and the D.A.S.H. Diet are the best, but often, just modifying a few factors can be helpful:  Don't skip meals, don't eat out, and keeping a food diary.  I do not recommend fad diets or diet pills which often raise blood pressure.    Everyone should get regular exercise, but this is particularly important for people with high blood pressure.  Just about any exercise is good.  The only exercise which may be harmful is lifting extreme heavy  weights.  I recommend moderate exercise such as walking for 30 minutes 5 days a week.  Going to the gym for a 50 minute workout 3 times a week is also good.  This amounts to 150 minutes of exercise weekly.   Anyone with high blood pressure should avoid any use of tobacco.  Tobacco use does not elevate blood pressure, but it increases the risk of heart disease and stroke.  If you are interested in quitting, discuss with your doctor how to quit.  If you are not interested in quitting, ask yourself, "What would my life be like in 10 years if I continue to smoke?"  "How will I know when it is time to quit?"  "How would my life be better if I were to quit."   Excess alcohol intake can raise the blood pressure.  The safe alcohol intake is 2 drinks or less per day for men and 1 drink per day or less for women.   There is a very good diet which I recommend that has been designed for people with blood pressure called the D.A.S.H. Diet (dietary approaches to stop hypertension).  It consists of fruits, vegetables, lean meats, low fat dairy, whole grains, nuts and seeds.  It is very low in salt and sodium.  It has also been found to have other beneficial health effects such as lowering cholesterol and helping lose weight.  It has been developed by the [employer] and can be downloaded from the internet without any cost. Just do a Programmer, multimedia on "D.A.S.H. Diet." or go the NIH website (G[employer]ingPosters.tn).  There are also cookbooks and diet plans that can be gotten from [place] to help you with this diet.

## 2014-03-03 NOTE — ED Provider Notes (Signed)
Chief Complaint   Chief Complaint  Patient presents with  . Puncture Wound    History of Present Illness   Teresa [name] is a 33 year old female who stepped on a sea pod from a sweet gum tree in her bare feet, today in her yard at home. There are multiple small thorns embedded in the plantar aspect of her right foot. She try to remove these at home but was unable to do so. It's painful to walk on the foot. Her last tetanus shot was about 2-3 years ago. 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  Her second problem has been that of nasal congestion, yellowish drainage, postnasal drip, and cough.  Review of Systems   Other than as noted above, the patient denies any of the following symptoms: Systemic:  No fevers or chills. Musculoskeletal:  No joint pain or arthritis.  Neurological:  No muscular weakness, paresthesias.   PMFSH   Past medical history, family history, social history, meds, and allergies were reviewed.     Physical  Examination     Vital signs:  BP 161/102  Pulse 79  Temp(Src) 98.1 F (36.7 C) (Oral)  Resp 18  SpO2 97% Gen:  Alert and oriented times 3.  In no distress. Musculoskeletal:  Exam of the foot reveals multiple small puncture wounds with embedded thorns in the plantar aspect of the right foot.  Otherwise, all joints had a full a ROM with no swelling, bruising or deformity.  No edema, pulses full. Extremities were warm and pink.  Capillary refill was brisk.  Skin:  Clear, warm and dry.  No rash. Neuro:  Alert and oriented times 3.  Muscle strength was normal.  Sensation was intact to light touch.   Procedure Note:  Verbal informed consent was obtained from the patient.  Risks and benefits were outlined with the patient.  Patient understands and accepts these risks. A time out was called and the name of the procedure, the procedure site, and identity of the patient were confirmed verbally and by wristband.    The procedure was then  performed as follows:  The plantar aspect right foot was prepped with alcohol and Betadine and the area of the puncture wounds was anesthetized with 2% Xylocaine without epinephrine. Using a #11 scalpel blade, multiple small thorns were removed from the bottom of the foot, probably about 15-20 all. There was minimal bleeding. The area was then cleansed with alcohol and a sterile dressing was applied. She was given a postoperative boot.  The patient tolerated the procedure well without any immediate complications.   Assessment   The primary encounter diagnosis was Foreign body in right foot, initial encounter. A diagnosis of Acute sinusitis, recurrence not specified, unspecified location was also pertinent to this visit.  She'll need to stay off the foot, soak in Epsom salt water, apply antibiotic ointment 3 times a day. She should watch for signs of infection return if this gets worse in anyway. She was also given some Flonase for the nasal congestion. She was given Keflex both for infection prevention and for the sinusitis symptoms.  Plan    1.  Meds:  The following meds were prescribed:   Discharge Medication List as of 03/03/2014  8:47 PM    START taking these medications   Details  cephALEXin (KEFLEX) 500 MG capsule Take 1 capsule (500 mg total) by mouth 3 (three) times daily., Starting 03/03/2014, Until Discontinued, Normal    fluticasone (  FLONASE) 50 MCG/ACT nasal spray Place 2 sprays into both nostrils daily., Starting 03/03/2014, Until Discontinued, Normal    HYDROcodone-acetaminophen (NORCO/VICODIN) 5-325 MG per tablet 1 to 2 tabs every 4 to 6 hours as needed for pain., Print    mupirocin ointment (BACTROBAN) 2 % Apply 1 application topically 3 (three) times daily., Starting 03/03/2014, Until Discontinued, Normal        2.  Patient Education/Counseling:  The patient was given appropriate handouts, self care instructions, and instructed in symptomatic relief including rest and activity,  elevation, application of ice and compression.    3.  Follow up:  The patient was told to follow up here if no better in 3 to 4 days, or sooner if becoming worse in any way, and given some red flag symptoms such as worsening pain or neurological symptoms which would prompt immediate return.       Reuben Likes, MD 03/03/14 [phone]
